# Patient Record
Sex: Male | Born: 1975 | Race: White | Hispanic: No | Marital: Single | Smoking: Never smoker
Health system: Southern US, Community
[De-identification: ages and names within clinical notes are randomized; demographics above are authoritative.]

## PROBLEM LIST (undated history)

## (undated) ENCOUNTER — Emergency Department (EMERGENCY_DEPARTMENT_HOSPITAL): Admission: EM | Payer: No Typology Code available for payment source | Source: Home / Self Care

## (undated) DIAGNOSIS — I10 Essential (primary) hypertension: Secondary | ICD-10-CM

## (undated) DEATH — deceased

---

## 2000-08-24 ENCOUNTER — Encounter (HOSPITAL_BASED_OUTPATIENT_CLINIC_OR_DEPARTMENT_OTHER): Payer: Self-pay | Admitting: Neuropsychiatry

## 2005-03-22 ENCOUNTER — Ambulatory Visit (EMERGENCY_DEPARTMENT_HOSPITAL): Payer: Medicaid Other

## 2005-04-03 ENCOUNTER — Ambulatory Visit (HOSPITAL_BASED_OUTPATIENT_CLINIC_OR_DEPARTMENT_OTHER): Payer: Self-pay

## 2005-09-24 ENCOUNTER — Ambulatory Visit (EMERGENCY_DEPARTMENT_HOSPITAL)

## 2005-12-19 ENCOUNTER — Encounter (HOSPITAL_BASED_OUTPATIENT_CLINIC_OR_DEPARTMENT_OTHER): Payer: Self-pay | Admitting: Physician Assistant

## 2009-07-26 ENCOUNTER — Ambulatory Visit (HOSPITAL_BASED_OUTPATIENT_CLINIC_OR_DEPARTMENT_OTHER): Payer: Medicaid Other

## 2009-07-26 ENCOUNTER — Emergency Department (HOSPITAL_BASED_OUTPATIENT_CLINIC_OR_DEPARTMENT_OTHER)
Admission: EM | Admit: 2009-07-26 | Discharge: 2009-07-26 | Disposition: A | Discharge status: Home / Self Care | Payer: Medicaid Other | Attending: Nurse Practitioner | Admitting: Nurse Practitioner

## 2009-07-26 DIAGNOSIS — F1410 Cocaine abuse, uncomplicated: Secondary | ICD-10-CM | POA: Insufficient documentation

## 2009-07-26 DIAGNOSIS — F39 Unspecified mood [affective] disorder: Secondary | ICD-10-CM | POA: Insufficient documentation

## 2009-07-26 DIAGNOSIS — F1020 Alcohol dependence, uncomplicated: Secondary | ICD-10-CM | POA: Insufficient documentation

## 2009-09-18 ENCOUNTER — Emergency Department (HOSPITAL_BASED_OUTPATIENT_CLINIC_OR_DEPARTMENT_OTHER)
Admission: EM | Admit: 2009-09-18 | Discharge: 2009-09-19 | Disposition: A | Discharge status: Other Acute Inpatient Hospital | Payer: Medicaid Other | Attending: Nurse Practitioner | Admitting: Nurse Practitioner

## 2009-09-18 DIAGNOSIS — F39 Unspecified mood [affective] disorder: Secondary | ICD-10-CM | POA: Insufficient documentation

## 2009-09-18 DIAGNOSIS — R45851 Suicidal ideations: Secondary | ICD-10-CM | POA: Insufficient documentation

## 2009-09-18 DIAGNOSIS — F3289 Other specified depressive episodes: Secondary | ICD-10-CM | POA: Insufficient documentation

## 2009-09-18 DIAGNOSIS — F1020 Alcohol dependence, uncomplicated: Secondary | ICD-10-CM | POA: Insufficient documentation

## 2009-09-18 DIAGNOSIS — F1410 Cocaine abuse, uncomplicated: Secondary | ICD-10-CM | POA: Insufficient documentation

## 2009-10-30 ENCOUNTER — Emergency Department (HOSPITAL_BASED_OUTPATIENT_CLINIC_OR_DEPARTMENT_OTHER)
Admission: EM | Admit: 2009-10-30 | Discharge: 2009-10-30 | Disposition: A | Discharge status: Home / Self Care | Payer: Medicaid Other | Attending: Emergency Medicine | Admitting: Emergency Medicine

## 2009-10-30 DIAGNOSIS — F1910 Other psychoactive substance abuse, uncomplicated: Secondary | ICD-10-CM | POA: Insufficient documentation

## 2009-10-31 ENCOUNTER — Emergency Department (HOSPITAL_BASED_OUTPATIENT_CLINIC_OR_DEPARTMENT_OTHER)
Admission: EM | Admit: 2009-10-31 | Discharge: 2009-10-31 | Payer: Medicaid Other | Attending: Psychiatry | Admitting: Psychiatry

## 2009-10-31 DIAGNOSIS — F1020 Alcohol dependence, uncomplicated: Secondary | ICD-10-CM | POA: Insufficient documentation

## 2009-10-31 DIAGNOSIS — F29 Unspecified psychosis not due to a substance or known physiological condition: Secondary | ICD-10-CM | POA: Insufficient documentation

## 2009-10-31 DIAGNOSIS — F1220 Cannabis dependence, uncomplicated: Secondary | ICD-10-CM | POA: Insufficient documentation

## 2009-10-31 DIAGNOSIS — Z5900 Homelessness unspecified: Secondary | ICD-10-CM | POA: Insufficient documentation

## 2009-10-31 DIAGNOSIS — F602 Antisocial personality disorder: Secondary | ICD-10-CM | POA: Insufficient documentation

## 2009-10-31 DIAGNOSIS — F3289 Other specified depressive episodes: Secondary | ICD-10-CM | POA: Insufficient documentation

## 2009-10-31 DIAGNOSIS — R45851 Suicidal ideations: Secondary | ICD-10-CM | POA: Insufficient documentation

## 2010-07-24 ENCOUNTER — Emergency Department (HOSPITAL_BASED_OUTPATIENT_CLINIC_OR_DEPARTMENT_OTHER)
Admission: EM | Admit: 2010-07-24 | Discharge: 2010-07-24 | Disposition: A | Discharge status: Home / Self Care | Payer: Self-pay | Attending: Student in an Organized Health Care Education/Training Program | Admitting: Student in an Organized Health Care Education/Training Program

## 2010-07-24 DIAGNOSIS — T148XXA Other injury of unspecified body region, initial encounter: Secondary | ICD-10-CM | POA: Insufficient documentation

## 2010-07-24 DIAGNOSIS — S71009A Unspecified open wound, unspecified hip, initial encounter: Secondary | ICD-10-CM | POA: Insufficient documentation

## 2010-07-24 DIAGNOSIS — S71109A Unspecified open wound, unspecified thigh, initial encounter: Secondary | ICD-10-CM | POA: Insufficient documentation

## 2010-11-04 ENCOUNTER — Emergency Department
Admission: EM | Admit: 2010-11-04 | Discharge: 2010-11-04 | Disposition: A | Discharge status: Home / Self Care | Payer: Medicaid Other | Attending: Registered Nurse | Admitting: Registered Nurse

## 2010-11-04 DIAGNOSIS — IMO0002 Reserved for concepts with insufficient information to code with codable children: Secondary | ICD-10-CM | POA: Insufficient documentation

## 2010-11-04 DIAGNOSIS — Y9301 Activity, walking, marching and hiking: Secondary | ICD-10-CM | POA: Insufficient documentation

## 2010-11-04 DIAGNOSIS — W010XXA Fall on same level from slipping, tripping and stumbling without subsequent striking against object, initial encounter: Secondary | ICD-10-CM | POA: Insufficient documentation

## 2011-07-12 ENCOUNTER — Other Ambulatory Visit (EMERGENCY_DEPARTMENT_HOSPITAL): Payer: Self-pay

## 2011-07-12 ENCOUNTER — Emergency Department (HOSPITAL_BASED_OUTPATIENT_CLINIC_OR_DEPARTMENT_OTHER)
Admission: EM | Admit: 2011-07-12 | Discharge: 2011-07-13 | Disposition: A | Discharge status: Home / Self Care | Payer: Self-pay | Attending: Psychiatric/Mental Health | Admitting: Psychiatric/Mental Health

## 2011-07-12 DIAGNOSIS — F3289 Other specified depressive episodes: Secondary | ICD-10-CM | POA: Insufficient documentation

## 2011-07-12 DIAGNOSIS — F1020 Alcohol dependence, uncomplicated: Secondary | ICD-10-CM | POA: Insufficient documentation

## 2011-07-12 DIAGNOSIS — F1220 Cannabis dependence, uncomplicated: Secondary | ICD-10-CM | POA: Insufficient documentation

## 2011-07-12 DIAGNOSIS — F29 Unspecified psychosis not due to a substance or known physiological condition: Secondary | ICD-10-CM | POA: Insufficient documentation

## 2011-07-12 DIAGNOSIS — Z5900 Homelessness unspecified: Secondary | ICD-10-CM | POA: Insufficient documentation

## 2011-07-12 DIAGNOSIS — F602 Antisocial personality disorder: Secondary | ICD-10-CM | POA: Insufficient documentation

## 2011-07-12 LAB — DIFF/SMEAR EVALUATION ADD
% Basophils: 0 % (ref 0–1)
% Eosinophils: 1 % (ref 0–7)
% Immature Granulocytes: 0 % (ref 0–1)
% Lymphocytes: 35 % (ref 19–53)
% Monocytes: 12 % (ref 5–13)
% Neutrophils: 52 % (ref 34–71)
Absolute Eosinophil Count: 0.08 10*3/uL (ref 0.00–0.50)
Absolute Lymphocyte Count: 3.56 10*3/uL (ref 1.00–4.80)
Basophils: 0.03 10*3/uL (ref 0.00–0.20)
Immature Granulocytes: 0.03 10*3/uL (ref 0.00–0.05)
Monocytes: 1.2 10*3/uL — ABNORMAL HIGH (ref 0.00–0.80)
Neutrophils: 5.39 10*3/uL (ref 1.80–7.00)

## 2011-07-12 LAB — CBC (HEMOGRAM)
Hematocrit: 39 % (ref 38–50)
Hemoglobin: 13.3 g/dL (ref 13.0–18.0)
MCH: 30.1 pg (ref 27.3–33.6)
MCHC: 34.3 g/dL (ref 32.2–36.5)
MCV: 88 fL (ref 81–98)
Platelet Count: 179 10*3/uL (ref 150–400)
RBC: 4.42 mil/uL (ref 4.40–5.60)
RDW-CV: 12.3 % (ref 11.6–14.4)
WBC: 10.26 10*3/uL — ABNORMAL HIGH (ref 4.30–10.00)

## 2011-07-12 LAB — COMPREHENSIVE METABOLIC PANEL
ALT (GPT): 15 U/L (ref 10–64)
AST (GOT): 27 U/L (ref 15–40)
Albumin: 4.1 g/dL (ref 3.5–5.2)
Alkaline Phosphatase (Total): 47 U/L (ref 36–122)
Anion Gap: 13 — ABNORMAL HIGH (ref 3–11)
Bilirubin (Total): 0.7 mg/dL (ref 0.2–1.3)
Calcium: 8.7 mg/dL — ABNORMAL LOW (ref 8.9–10.2)
Carbon Dioxide, Total: 25 mEq/L (ref 22–32)
Chloride: 102 mEq/L (ref 98–108)
Creatinine: 1.01 mg/dL (ref 0.51–1.18)
GFR, Calc, African American: >60 mL/min (ref >59)
GFR, Calc, European American: >60 mL/min (ref >59)
Glucose: 99 mg/dL (ref 62–125)
Potassium: 3.9 mEq/L (ref 3.7–5.2)
Protein (Total): 7 g/dL (ref 6.0–8.2)
Sodium: 140 mEq/L (ref 136–145)
Urea Nitrogen: 13 mg/dL (ref 8–21)

## 2011-07-12 LAB — 1ST EXTRA RED TOP

## 2011-07-12 LAB — ALCOHOL (ETHYL): Alcohol (Ethyl): 22 mg/dL — AB

## 2011-07-13 ENCOUNTER — Ambulatory Visit (HOSPITAL_BASED_OUTPATIENT_CLINIC_OR_DEPARTMENT_OTHER): Admit: 2011-07-13 | Discharge: 2011-07-13 | Disposition: A | Discharge status: Home / Self Care | Payer: Self-pay

## 2017-04-01 ENCOUNTER — Encounter: Payer: Self-pay | Admitting: *Deleted

## 2017-04-01 ENCOUNTER — Emergency Department
Admission: EM | Admit: 2017-04-01 | Discharge: 2017-04-01 | Disposition: A | Discharge status: Home / Self Care | Payer: Self-pay | Attending: Emergency Medicine | Admitting: Emergency Medicine

## 2017-04-01 ENCOUNTER — Emergency Department: Payer: Self-pay

## 2017-04-01 DIAGNOSIS — K047 Periapical abscess without sinus: Secondary | ICD-10-CM

## 2017-04-01 DIAGNOSIS — I10 Essential (primary) hypertension: Secondary | ICD-10-CM | POA: Insufficient documentation

## 2017-04-01 DIAGNOSIS — S20211D Contusion of right front wall of thorax, subsequent encounter: Secondary | ICD-10-CM

## 2017-04-01 HISTORY — DX: Essential (primary) hypertension: I10

## 2017-04-01 MED ORDER — PENICILLIN V POTASSIUM 500 MG PO TABS: 500.0000 mg | ORAL_TABLET | Freq: Four times a day (QID) | ORAL | 0 refills | Status: AC

## 2017-04-01 MED ORDER — PENICILLIN V POTASSIUM 500 MG PO TABS
500.0000 mg | ORAL_TABLET | Freq: Once | ORAL | Status: AC
  Administered 2017-04-01: 500 mg via ORAL
  Filled 2017-04-01: qty 1

## 2017-04-01 MED ORDER — LIDOCAINE-EPINEPHRINE 2 %-1:100000 IJ SOLN
1.7000 mL | Freq: Once | INTRAMUSCULAR | Status: AC
  Administered 2017-04-01: 1.7 mL
  Filled 2017-04-01: qty 1.7

## 2017-04-01 NOTE — Discharge Instructions (Signed)
Your exam and x-ray are negative for any evidence of fracture or dislocation to the ribs. You have been treated for an acute dental infection. Take the antibiotic as directed, until all of the pills are gone. Take Aleve for pain relief. Brush with a soft bristle toothbrush and rinse with warm-salty water after each meal. Follow-up with [facility], they take walk-ins for dental visits.

## 2017-04-01 NOTE — ED Notes (Signed)
Pt reports that he has dental abscess on upper left tooth and it radiates into the upper jaw and temple area - he states he went to the dentist yesterday and was told he needed atb but was told he did not have the proper insurance to be treated

## 2017-04-01 NOTE — ED Notes (Signed)
Pt was given a free bus pass for transportation to home.
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
## 2017-06-25 ENCOUNTER — Emergency Department (HOSPITAL_BASED_OUTPATIENT_CLINIC_OR_DEPARTMENT_OTHER)
Admission: EM | Admit: 2017-06-25 | Discharge: 2017-06-25 | Disposition: A | Discharge status: Home / Self Care | Payer: No Typology Code available for payment source | Attending: Nurse Practitioner | Admitting: Nurse Practitioner

## 2017-06-25 DIAGNOSIS — Z590 Homelessness: Secondary | ICD-10-CM | POA: Insufficient documentation

## 2017-06-25 DIAGNOSIS — L02811 Cutaneous abscess of head [any part, except face]: Secondary | ICD-10-CM | POA: Insufficient documentation

## 2017-08-16 ENCOUNTER — Emergency Department (HOSPITAL_BASED_OUTPATIENT_CLINIC_OR_DEPARTMENT_OTHER)
Admission: EM | Admit: 2017-08-16 | Discharge: 2017-08-17 | Disposition: A | Discharge status: Other Acute Inpatient Hospital | Payer: No Typology Code available for payment source | Attending: Emergency Medicine | Admitting: Emergency Medicine

## 2017-08-16 DIAGNOSIS — X838XXA Intentional self-harm by other specified means, initial encounter: Secondary | ICD-10-CM | POA: Insufficient documentation

## 2017-08-16 DIAGNOSIS — Z590 Homelessness: Secondary | ICD-10-CM | POA: Insufficient documentation

## 2017-08-16 DIAGNOSIS — F29 Unspecified psychosis not due to a substance or known physiological condition: Secondary | ICD-10-CM | POA: Insufficient documentation

## 2017-08-16 DIAGNOSIS — S199XXA Unspecified injury of neck, initial encounter: Secondary | ICD-10-CM | POA: Insufficient documentation

## 2017-08-16 LAB — CBC (HEMOGRAM)
Hematocrit: 43 % (ref 38–50)
Hemoglobin: 13.6 g/dL (ref 13.0–18.0)
MCH: 30 pg (ref 27.3–33.6)
MCHC: 31.9 g/dL — ABNORMAL LOW (ref 32.2–36.5)
MCV: 94 fL (ref 81–98)
Platelet Count: 239 10*3/uL (ref 150–400)
RBC: 4.54 10*6/uL (ref 4.40–5.60)
RDW-CV: 12.4 % (ref 11.6–14.4)
WBC: 10.1 10*3/uL — ABNORMAL HIGH (ref 4.30–10.00)

## 2017-08-16 LAB — BASIC METABOLIC PANEL
Anion Gap: 7 (ref 4–12)
Calcium: 9.6 mg/dL (ref 8.9–10.2)
Carbon Dioxide, Total: 27 meq/L (ref 22–32)
Chloride: 103 meq/L (ref 98–108)
Creatinine: 0.67 mg/dL (ref 0.51–1.18)
GFR, Calc, African American: >60 mL/min/{1.73_m2} (ref >59)
GFR, Calc, European American: >60 mL/min/{1.73_m2} (ref >59)
Glucose: 85 mg/dL (ref 62–125)
Potassium: 4 meq/L (ref 3.6–5.2)
Sodium: 137 meq/L (ref 135–145)
Urea Nitrogen: 16 mg/dL (ref 8–21)

## 2017-08-16 LAB — ALCOHOL (ETHYL): Alcohol (Ethyl): NEGATIVE mg/dL

## 2017-08-17 ENCOUNTER — Other Ambulatory Visit: Payer: Self-pay | Admitting: Student in an Organized Health Care Education/Training Program

## 2017-08-17 DIAGNOSIS — S199XXA Unspecified injury of neck, initial encounter: Secondary | ICD-10-CM

## 2017-08-17 LAB — STANDARD DRUG SCREEN, URN
Acetaminophen Qualitative, URN: NEGATIVE
Alcohol (Ethyl), URN: NEGATIVE mg/dL
Amphet/Methamphetamine Qual,URN: NEGATIVE
Barbiturate (Qual), URN: NEGATIVE
Benzodiazepines (Qual), URN: NEGATIVE
Cannabinoids (Qual), URN: POSITIVE — AB
Cocaine (Qual), URN: NEGATIVE
Methadone (Qual), URN: NEGATIVE
Opiates (Qual), URN: NEGATIVE
Phencyclidine (Qual), URN: NEGATIVE
Tricyclic Antidepressants, URN: NEGATIVE

## 2017-08-25 ENCOUNTER — Encounter (HOSPITAL_BASED_OUTPATIENT_CLINIC_OR_DEPARTMENT_OTHER): Payer: No Typology Code available for payment source | Admitting: Nurse Practitioner

## 2017-09-04 ENCOUNTER — Emergency Department (HOSPITAL_BASED_OUTPATIENT_CLINIC_OR_DEPARTMENT_OTHER): Admission: EM | Admit: 2017-09-04 | Discharge: 2017-09-04 | Payer: No Typology Code available for payment source

## 2017-10-20 ENCOUNTER — Ambulatory Visit (HOSPITAL_BASED_OUTPATIENT_CLINIC_OR_DEPARTMENT_OTHER): Payer: No Typology Code available for payment source | Admitting: Nurse Practitioner

## 2017-10-20 DIAGNOSIS — G8929 Other chronic pain: Secondary | ICD-10-CM

## 2017-10-20 DIAGNOSIS — M546 Pain in thoracic spine: Secondary | ICD-10-CM
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
## 2017-11-04 ENCOUNTER — Other Ambulatory Visit: Payer: Self-pay | Admitting: Physician Assistant

## 2017-11-04 ENCOUNTER — Emergency Department (HOSPITAL_BASED_OUTPATIENT_CLINIC_OR_DEPARTMENT_OTHER)
Admission: EM | Admit: 2017-11-04 | Discharge: 2017-11-04 | Disposition: A | Discharge status: Home / Self Care | Payer: No Typology Code available for payment source | Attending: Physician Assistant | Admitting: Physician Assistant

## 2017-11-04 DIAGNOSIS — R05 Cough: Secondary | ICD-10-CM | POA: Insufficient documentation

## 2017-11-04 DIAGNOSIS — R0981 Nasal congestion: Secondary | ICD-10-CM | POA: Insufficient documentation

## 2017-11-04 DIAGNOSIS — Z590 Homelessness: Secondary | ICD-10-CM | POA: Insufficient documentation

## 2017-11-04 DIAGNOSIS — F17210 Nicotine dependence, cigarettes, uncomplicated: Secondary | ICD-10-CM | POA: Insufficient documentation

## 2017-11-04 DIAGNOSIS — Z20828 Contact with and (suspected) exposure to other viral communicable diseases: Secondary | ICD-10-CM | POA: Insufficient documentation

## 2017-11-04 DIAGNOSIS — B356 Tinea cruris: Secondary | ICD-10-CM | POA: Insufficient documentation

## 2017-11-05 ENCOUNTER — Encounter (HOSPITAL_BASED_OUTPATIENT_CLINIC_OR_DEPARTMENT_OTHER): Payer: No Typology Code available for payment source | Admitting: Nurse Practitioner
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
## 2018-01-29 ENCOUNTER — Other Ambulatory Visit: Payer: Self-pay | Admitting: Emergency Medicine

## 2018-01-29 ENCOUNTER — Emergency Department (HOSPITAL_BASED_OUTPATIENT_CLINIC_OR_DEPARTMENT_OTHER)
Admission: EM | Admit: 2018-01-29 | Discharge: 2018-01-29 | Disposition: A | Discharge status: Home / Self Care | Payer: No Typology Code available for payment source | Attending: Emergency Medicine | Admitting: Emergency Medicine

## 2018-01-29 DIAGNOSIS — S86812A Strain of other muscle(s) and tendon(s) at lower leg level, left leg, initial encounter: Secondary | ICD-10-CM | POA: Insufficient documentation

## 2018-01-29 DIAGNOSIS — M7042 Prepatellar bursitis, left knee: Secondary | ICD-10-CM | POA: Insufficient documentation

## 2018-01-29 DIAGNOSIS — S79922A Unspecified injury of left thigh, initial encounter: Secondary | ICD-10-CM | POA: Insufficient documentation

## 2018-01-29 DIAGNOSIS — S8992XA Unspecified injury of left lower leg, initial encounter: Secondary | ICD-10-CM

## 2018-01-29 DIAGNOSIS — W19XXXA Unspecified fall, initial encounter: Secondary | ICD-10-CM | POA: Insufficient documentation

## 2018-01-31 ENCOUNTER — Inpatient Hospital Stay: Payer: Self-pay

## 2018-02-01 ENCOUNTER — Inpatient Hospital Stay: Payer: Self-pay

## 2018-02-04 ENCOUNTER — Other Ambulatory Visit (HOSPITAL_BASED_OUTPATIENT_CLINIC_OR_DEPARTMENT_OTHER): Payer: Self-pay

## 2018-02-04 DIAGNOSIS — S82002A Unspecified fracture of left patella, initial encounter for closed fracture: Secondary | ICD-10-CM

## 2018-02-11 ENCOUNTER — Ambulatory Visit (HOSPITAL_BASED_OUTPATIENT_CLINIC_OR_DEPARTMENT_OTHER): Payer: No Typology Code available for payment source

## 2018-02-22 ENCOUNTER — Inpatient Hospital Stay: Payer: Self-pay

## 2018-03-27 ENCOUNTER — Inpatient Hospital Stay: Payer: Self-pay

## 2018-03-27 ENCOUNTER — Emergency Department (HOSPITAL_BASED_OUTPATIENT_CLINIC_OR_DEPARTMENT_OTHER)
Admission: EM | Admit: 2018-03-27 | Discharge: 2018-03-28 | Payer: No Typology Code available for payment source | Attending: Psychiatric/Mental Health | Admitting: Psychiatric/Mental Health

## 2018-03-27 DIAGNOSIS — F250 Schizoaffective disorder, bipolar type: Secondary | ICD-10-CM | POA: Insufficient documentation

## 2018-03-27 DIAGNOSIS — F329 Major depressive disorder, single episode, unspecified: Secondary | ICD-10-CM | POA: Insufficient documentation

## 2018-03-27 DIAGNOSIS — F1510 Other stimulant abuse, uncomplicated: Secondary | ICD-10-CM | POA: Insufficient documentation

## 2018-03-27 DIAGNOSIS — R45851 Suicidal ideations: Secondary | ICD-10-CM | POA: Insufficient documentation

## 2018-03-27 DIAGNOSIS — Z590 Homelessness: Secondary | ICD-10-CM

## 2018-03-27 LAB — STANDARD DRUG SCREEN, URN
Acetaminophen Qualitative, URN: NEGATIVE
Alcohol (Ethyl), URN: NEGATIVE mg/dL
Amphet/Methamphetamine Qual,URN: POSITIVE — AB
Barbiturate (Qual), URN: NEGATIVE
Benzodiazepines (Qual), URN: NEGATIVE
Cannabinoids (Qual), URN: POSITIVE — AB
Cocaine (Qual), URN: NEGATIVE
Methadone (Qual), URN: NEGATIVE
Opiates (Qual), URN: NEGATIVE
Phencyclidine (Qual), URN: NEGATIVE
Tricyclic Antidepressants, URN: NEGATIVE

## 2018-04-15 ENCOUNTER — Inpatient Hospital Stay (HOSPITAL_BASED_OUTPATIENT_CLINIC_OR_DEPARTMENT_OTHER)
Admission: EM | Admit: 2018-04-15 | Discharge: 2018-04-20 | DRG: 460 | Disposition: A | Discharge status: Other Acute Inpatient Hospital | Payer: No Typology Code available for payment source | Attending: Internal Medicine | Admitting: Internal Medicine

## 2018-04-15 ENCOUNTER — Inpatient Hospital Stay (HOSPITAL_COMMUNITY): Payer: No Typology Code available for payment source | Admitting: Internal Medicine

## 2018-04-15 DIAGNOSIS — R34 Anuria and oliguria: Secondary | ICD-10-CM | POA: Diagnosis present

## 2018-04-15 DIAGNOSIS — F329 Major depressive disorder, single episode, unspecified: Secondary | ICD-10-CM

## 2018-04-15 DIAGNOSIS — E872 Acidosis: Secondary | ICD-10-CM | POA: Diagnosis present

## 2018-04-15 DIAGNOSIS — Z886 Allergy status to analgesic agent status: Secondary | ICD-10-CM

## 2018-04-15 DIAGNOSIS — N179 Acute kidney failure, unspecified: Principal | ICD-10-CM | POA: Diagnosis present

## 2018-04-15 DIAGNOSIS — F15159 Other stimulant abuse with stimulant-induced psychotic disorder, unspecified: Secondary | ICD-10-CM | POA: Diagnosis present

## 2018-04-15 DIAGNOSIS — F1210 Cannabis abuse, uncomplicated: Secondary | ICD-10-CM

## 2018-04-15 DIAGNOSIS — B1920 Unspecified viral hepatitis C without hepatic coma: Secondary | ICD-10-CM | POA: Diagnosis present

## 2018-04-15 DIAGNOSIS — Z781 Physical restraint status: Secondary | ICD-10-CM

## 2018-04-15 DIAGNOSIS — F1010 Alcohol abuse, uncomplicated: Secondary | ICD-10-CM

## 2018-04-15 DIAGNOSIS — F1510 Other stimulant abuse, uncomplicated: Secondary | ICD-10-CM

## 2018-04-15 DIAGNOSIS — F12159 Cannabis abuse with psychotic disorder, unspecified: Secondary | ICD-10-CM | POA: Diagnosis present

## 2018-04-15 DIAGNOSIS — F1190 Opioid use, unspecified, uncomplicated: Secondary | ICD-10-CM

## 2018-04-15 DIAGNOSIS — Z915 Personal history of self-harm: Secondary | ICD-10-CM

## 2018-04-15 DIAGNOSIS — Z818 Family history of other mental and behavioral disorders: Secondary | ICD-10-CM

## 2018-04-15 DIAGNOSIS — Z9114 Patient's other noncompliance with medication regimen: Secondary | ICD-10-CM

## 2018-04-15 DIAGNOSIS — F19959 Other psychoactive substance use, unspecified with psychoactive substance-induced psychotic disorder, unspecified: Secondary | ICD-10-CM

## 2018-04-15 DIAGNOSIS — Z62810 Personal history of physical and sexual abuse in childhood: Secondary | ICD-10-CM | POA: Diagnosis present

## 2018-04-15 DIAGNOSIS — Z888 Allergy status to other drugs, medicaments and biological substances status: Secondary | ICD-10-CM

## 2018-04-15 DIAGNOSIS — F250 Schizoaffective disorder, bipolar type: Secondary | ICD-10-CM | POA: Diagnosis present

## 2018-04-15 DIAGNOSIS — F11159 Opioid abuse with opioid-induced psychotic disorder, unspecified: Secondary | ICD-10-CM | POA: Diagnosis not present

## 2018-04-15 DIAGNOSIS — M6282 Rhabdomyolysis: Secondary | ICD-10-CM | POA: Diagnosis present

## 2018-04-15 DIAGNOSIS — R45851 Suicidal ideations: Secondary | ICD-10-CM | POA: Diagnosis present

## 2018-04-15 DIAGNOSIS — Z590 Homelessness: Secondary | ICD-10-CM

## 2018-04-15 LAB — CBC (HEMOGRAM)
Hematocrit: 43 % (ref 38–50)
Hemoglobin: 14.4 g/dL (ref 13.0–18.0)
MCH: 29.9 pg (ref 27.3–33.6)
MCHC: 33.4 g/dL (ref 32.2–36.5)
MCV: 89 fL (ref 81–98)
Platelet Count: 248 10*3/uL (ref 150–400)
RBC: 4.82 10*6/uL (ref 4.40–5.60)
RDW-CV: 12.9 % (ref 11.6–14.4)
WBC: 14.37 10*3/uL — ABNORMAL HIGH (ref 4.30–10.00)

## 2018-04-15 LAB — BASIC METABOLIC PANEL
Anion Gap: 14 — ABNORMAL HIGH (ref 4–12)
Calcium: 8.7 mg/dL — ABNORMAL LOW (ref 8.9–10.2)
Carbon Dioxide, Total: 23 meq/L (ref 22–32)
Chloride: 94 meq/L — ABNORMAL LOW (ref 98–108)
Creatinine: 2.94 mg/dL — ABNORMAL HIGH (ref 0.51–1.18)
GFR, Calc, African American: 29 mL/min/{1.73_m2} — ABNORMAL LOW (ref >59)
GFR, Calc, European American: 24 mL/min/{1.73_m2} — ABNORMAL LOW (ref >59)
Glucose: 95 mg/dL (ref 62–125)
Potassium: 4 meq/L (ref 3.6–5.2)
Sodium: 131 meq/L — ABNORMAL LOW (ref 135–145)
Urea Nitrogen: 63 mg/dL — ABNORMAL HIGH (ref 8–21)

## 2018-04-15 LAB — COMPREHENSIVE METABOLIC PANEL
ALT (GPT): 47 U/L (ref 10–64)
AST (GOT): 54 U/L — ABNORMAL HIGH (ref 9–38)
Albumin: 4.9 g/dL (ref 3.5–5.2)
Alkaline Phosphatase (Total): 58 U/L (ref 36–122)
Anion Gap: 18 — ABNORMAL HIGH (ref 4–12)
Bilirubin (Total): 1.3 mg/dL (ref 0.2–1.3)
Calcium: 9.4 mg/dL (ref 8.9–10.2)
Carbon Dioxide, Total: 21 meq/L — ABNORMAL LOW (ref 22–32)
Chloride: 92 meq/L — ABNORMAL LOW (ref 98–108)
Creatinine: 3.5 mg/dL — ABNORMAL HIGH (ref 0.51–1.18)
GFR, Calc, African American: 23 mL/min/{1.73_m2} — ABNORMAL LOW (ref >59)
GFR, Calc, European American: 19 mL/min/{1.73_m2} — ABNORMAL LOW (ref >59)
Glucose: 100 mg/dL (ref 62–125)
Potassium: 4.4 meq/L (ref 3.6–5.2)
Protein (Total): 8.8 g/dL — ABNORMAL HIGH (ref 6.0–8.2)
Sodium: 131 meq/L — ABNORMAL LOW (ref 135–145)
Urea Nitrogen: 65 mg/dL — ABNORMAL HIGH (ref 8–21)

## 2018-04-15 LAB — LAB ADD ON ORDER

## 2018-04-15 LAB — CK, CREATINE KINASE, TOTAL ACTIVITY
Creatine Kinase Total Activity: 1524 U/L — ABNORMAL HIGH (ref 62–325)
Creatine Kinase Total Activity: 1314 U/L — ABNORMAL HIGH (ref 62–325)

## 2018-04-15 LAB — ALCOHOL (ETHYL): Alcohol (Ethyl): NEGATIVE mg/dL

## 2018-04-15 LAB — ACETAMINOPHEN (TYLENOL): Acetaminophen (Tylenol): <10 ug/mL (ref 0–25)

## 2018-04-15 LAB — LIPASE: Lipase: 8 U/L (ref <70)

## 2018-04-16 DIAGNOSIS — F1599 Other stimulant use, unspecified with unspecified stimulant-induced disorder: Secondary | ICD-10-CM

## 2018-04-16 DIAGNOSIS — F1099 Alcohol use, unspecified with unspecified alcohol-induced disorder: Secondary | ICD-10-CM

## 2018-04-16 DIAGNOSIS — F329 Major depressive disorder, single episode, unspecified: Secondary | ICD-10-CM

## 2018-04-16 LAB — URINALYSIS WITH REFLEX CULTURE
Bilirubin (Qual), URN: NEGATIVE
Cast_Hyaline, URN: <1 /LPF — AB
Epith Cells_Renal/Trans,URN: NEGATIVE /HPF
Epith Cells_Squamous, URN: NEGATIVE /LPF
Glucose Qual, URN: NEGATIVE mg/dL
Leukocyte Esterase, URN: NEGATIVE
Nitrite, URN: NEGATIVE
Occult Blood, URN: NEGATIVE
RBC, URN: NEGATIVE /HPF
Specific Gravity, URN: 1.021 g/mL (ref 1.006–1.027)
WBC, URN: NEGATIVE /HPF
pH, URN: 6 (ref 5.0–8.0)

## 2018-04-16 LAB — HIV ANTIGEN AND ANTIBODY SCRN
HIV Antigen and Antibody Interpretation: NONREACTIVE
HIV Antigen and Antibody Result: NONREACTIVE

## 2018-04-16 LAB — BASIC METABOLIC PANEL
Anion Gap: 7 (ref 4–12)
Calcium: 8.7 mg/dL — ABNORMAL LOW (ref 8.9–10.2)
Carbon Dioxide, Total: 28 meq/L (ref 22–32)
Chloride: 99 meq/L (ref 98–108)
Creatinine: 1.42 mg/dL — ABNORMAL HIGH (ref 0.51–1.18)
GFR, Calc, African American: >60 mL/min/{1.73_m2} (ref >59)
GFR, Calc, European American: 55 mL/min/{1.73_m2} — ABNORMAL LOW (ref >59)
Glucose: 85 mg/dL (ref 62–125)
Potassium: 3.6 meq/L (ref 3.6–5.2)
Sodium: 134 meq/L — ABNORMAL LOW (ref 135–145)
Urea Nitrogen: 43 mg/dL — ABNORMAL HIGH (ref 8–21)

## 2018-04-17 DIAGNOSIS — F250 Schizoaffective disorder, bipolar type: Secondary | ICD-10-CM

## 2018-04-17 LAB — BASIC METABOLIC PANEL
Anion Gap: 7 (ref 4–12)
Calcium: 9.2 mg/dL (ref 8.9–10.2)
Carbon Dioxide, Total: 29 meq/L (ref 22–32)
Chloride: 101 meq/L (ref 98–108)
Creatinine: 0.79 mg/dL (ref 0.51–1.18)
GFR, Calc, African American: >60 mL/min/{1.73_m2} (ref >59)
GFR, Calc, European American: >60 mL/min/{1.73_m2} (ref >59)
Glucose: 83 mg/dL (ref 62–125)
Potassium: 3.9 meq/L (ref 3.6–5.2)
Sodium: 137 meq/L (ref 135–145)
Urea Nitrogen: 24 mg/dL — ABNORMAL HIGH (ref 8–21)

## 2018-04-18 LAB — BASIC METABOLIC PANEL
Anion Gap: 5 (ref 4–12)
Calcium: 8.9 mg/dL (ref 8.9–10.2)
Carbon Dioxide, Total: 32 meq/L (ref 22–32)
Chloride: 101 meq/L (ref 98–108)
Creatinine: 0.91 mg/dL (ref 0.51–1.18)
GFR, Calc, African American: >60 mL/min/{1.73_m2} (ref >59)
GFR, Calc, European American: >60 mL/min/{1.73_m2} (ref >59)
Glucose: 102 mg/dL (ref 62–125)
Potassium: 4.1 meq/L (ref 3.6–5.2)
Sodium: 138 meq/L (ref 135–145)
Urea Nitrogen: 19 mg/dL (ref 8–21)

## 2018-04-19 DIAGNOSIS — Z751 Person awaiting admission to adequate facility elsewhere: Secondary | ICD-10-CM

## 2018-04-20 DIAGNOSIS — F259 Schizoaffective disorder, unspecified: Secondary | ICD-10-CM

## 2018-04-20 DIAGNOSIS — R45851 Suicidal ideations: Secondary | ICD-10-CM

## 2018-04-20 DIAGNOSIS — N179 Acute kidney failure, unspecified: Secondary | ICD-10-CM

## 2018-06-03 ENCOUNTER — Ambulatory Visit (HOSPITAL_BASED_OUTPATIENT_CLINIC_OR_DEPARTMENT_OTHER): Payer: No Typology Code available for payment source | Admitting: Unknown Physician Specialty

## 2018-06-03 ENCOUNTER — Ambulatory Visit (HOSPITAL_BASED_OUTPATIENT_CLINIC_OR_DEPARTMENT_OTHER): Payer: No Typology Code available for payment source | Attending: Unknown Physician Specialty | Admitting: Nursing

## 2018-06-03 VITALS — BP 118/71 | HR 101 | Temp 99.5°F

## 2018-06-03 DIAGNOSIS — L0291 Cutaneous abscess, unspecified: Secondary | ICD-10-CM | POA: Insufficient documentation

## 2018-06-03 DIAGNOSIS — H60311 Diffuse otitis externa, right ear: Secondary | ICD-10-CM

## 2018-06-03 LAB — WOUND CULTURE W/GRAM ORDER

## 2018-06-03 NOTE — Progress Notes (Signed)
[facility]  ===================================================================    Adam Vance is a 42 year old male who presents to the [facility] for the following:   Chief Complaint   Patient presents with    Other     EAR       PCP:  Outside PCP  Identifies Patients Who Have A Non [facility] Pcp  Phone: None  Fax: None    Interpreter used for today's visit?: no    HPI:  Adam Vance presents to clinic today ear pain.    "Ever since [place] I've felt like something was in my ear."  [place] was 3-4 months ago. Attempted to remove with the chain of necklace last night and afterwards it started bleeding. Now it "hurts like hell".  Reports he has put several wires into his ear including a wire around the back of the ear beneath the skin that he can change the shape of his ear by manipulating.  He would like all of these wires removed so that they do not damage his ear drum.     He reports feeling feverish.  Denies nausea, vomiting, malaise.     Review of Systems:  ROS as above    Social History:  Social History     Social History Narrative    Not on file       Pt       ALLERGIES:  Patient has no allergy information on record.    There are no active problems to display for this patient.    No current outpatient medications on file.     No current facility-administered medications for this visit.      Health Maintenance   Topic Date Due    Pneumococcal Vaccine: Pediatrics (0-5 years) and At-Risk Patients (6-64 years) (1 of 1 - PPSV23) 07/21/1981    Depression Monitoring (PHQ-9)  07/22/1987    Tetanus Vaccine  07/21/1994    Influenza Vaccine (1) 08/24/2018    Lipid Disorders Screening  02/03/2023    Pneumococcal Vaccine: 65+ years (1 of 2 - PCV13) 07/21/2040    HIV Screening  Completed       Physical Examination:   Adam Vance  temperature is 99.5 F (37.5 C). His blood pressure is 118/71 and his pulse is 101 (abnormal). His oxygen saturation is 97%.   Wt Readings from Last  3 Encounters:   No data found for Wt     BP Readings from Last 3 Encounters:   06/03/18 118/71     General: non-toxic appearance.  Psych: Intense stare, delusional thought process  HEENT: Pupils 5 mm equal bilaterally, right ear with yellow pus filling ear canal, swelling/erythema of tragus, able to visualize TM with clearing of some of the pus, no foreign body appreciated  Resp: Breathing nonlabored, speaking in full sentences  MSK: ambulating normally    Assessment and Plan:   Clinical impression and plan of care was discussed with patient Adam Vance.    1. Abscess  2. Acute diffuse otitis externa of right ear  - WOUND CULTURE W/GRAM ORDER  Plan was to treat with antibiotics, however, patient was not satisfied that TW was not able to remove any wires from his ear and chose to go to the ED for further eval. Discussed with patient the importance of seeking treatment for infection ASAP.  Patient confirmed his understanding of this and his plan to go immediately to ED. He was provided with a bus ticket.  Total time of approximately 15 minutes was spent face-to-face with the patient, of which more than 50% was spent counseling and coordinating care, as described above in the A/P.    ===================================================================  No future appointments.    ~~~~~~~~~~~~~~~~~~~~~~~~~~~~~~~~~~~~~~~~  Shawna Orleans, ARNP  General Internal Medicine  [facility]  [facility]  ~~~~~~~~~~~~~~~~~~~~~~~~~~~~~~~~~~~~~~~~

## 2018-06-04 ENCOUNTER — Emergency Department (HOSPITAL_BASED_OUTPATIENT_CLINIC_OR_DEPARTMENT_OTHER)
Admission: EM | Admit: 2018-06-04 | Discharge: 2018-06-05 | Payer: No Typology Code available for payment source | Attending: Emergency Medicine | Admitting: Emergency Medicine

## 2018-06-04 DIAGNOSIS — F29 Unspecified psychosis not due to a substance or known physiological condition: Secondary | ICD-10-CM | POA: Insufficient documentation

## 2018-06-04 DIAGNOSIS — F1220 Cannabis dependence, uncomplicated: Secondary | ICD-10-CM | POA: Insufficient documentation

## 2018-06-04 DIAGNOSIS — F1520 Other stimulant dependence, uncomplicated: Secondary | ICD-10-CM

## 2018-06-04 DIAGNOSIS — H6091 Unspecified otitis externa, right ear: Secondary | ICD-10-CM | POA: Insufficient documentation

## 2018-06-04 LAB — CBC, DIFF
% Basophils: 0 %
% Eosinophils: 4 %
% Immature Granulocytes: 0 %
% Lymphocytes: 32 %
% Monocytes: 13 %
% Neutrophils: 51 %
% Nucleated RBC: 0 %
Absolute Eosinophil Count: 0.31 10*3/uL (ref 0.00–0.50)
Absolute Lymphocyte Count: 2.3 10*3/uL (ref 1.00–4.80)
Basophils: 0.03 10*3/uL (ref 0.00–0.20)
Hematocrit: 42 % (ref 38–50)
Hemoglobin: 13.5 g/dL (ref 13.0–18.0)
Immature Granulocytes: 0.03 10*3/uL (ref 0.00–0.05)
MCH: 30.8 pg (ref 27.3–33.6)
MCHC: 32 g/dL — ABNORMAL LOW (ref 32.2–36.5)
MCV: 96 fL (ref 81–98)
Monocytes: 0.9 10*3/uL — ABNORMAL HIGH (ref 0.00–0.80)
Neutrophils: 3.57 10*3/uL (ref 1.80–7.00)
Nucleated RBC: 0 10*3/uL
Platelet Count: 213 10*3/uL (ref 150–400)
RBC: 4.38 10*6/uL — ABNORMAL LOW (ref 4.40–5.60)
RDW-CV: 13.4 % (ref 11.6–14.4)
WBC: 7.14 10*3/uL (ref 4.30–10.00)

## 2018-06-04 LAB — DRUG (PSYCHIATRIC) SCRN, URN
Alcohol (Ethyl), URN: NEGATIVE mg/dL
Amphet/Methamphetamine Qual,URN: POSITIVE — AB
Barbiturate (Qual), URN: NEGATIVE
Benzodiazepines (Qual), URN: NEGATIVE
Cannabinoids (Qual), URN: POSITIVE — AB
Cocaine (Qual), URN: NEGATIVE
Opiates (Qual), URN: NEGATIVE
Phencyclidine (Qual), URN: NEGATIVE

## 2018-06-04 LAB — COMPREHENSIVE METABOLIC PANEL
ALT (GPT): 63 U/L (ref 10–64)
AST (GOT): 78 U/L — ABNORMAL HIGH (ref 9–38)
Albumin: 4.1 g/dL (ref 3.5–5.2)
Alkaline Phosphatase (Total): 38 U/L (ref 36–122)
Anion Gap: 13 — ABNORMAL HIGH (ref 4–12)
Bilirubin (Total): 1.2 mg/dL (ref 0.2–1.3)
Calcium: 9 mg/dL (ref 8.9–10.2)
Carbon Dioxide, Total: 22 meq/L (ref 22–32)
Chloride: 101 meq/L (ref 98–108)
Creatinine: 0.83 mg/dL (ref 0.51–1.18)
GFR, Calc, African American: >60 mL/min/{1.73_m2} (ref >59)
GFR, Calc, European American: >60 mL/min/{1.73_m2} (ref >59)
Glucose: 76 mg/dL (ref 62–125)
Potassium: 3.5 meq/L — ABNORMAL LOW (ref 3.6–5.2)
Protein (Total): 7.5 g/dL (ref 6.0–8.2)
Sodium: 136 meq/L (ref 135–145)
Urea Nitrogen: 20 mg/dL (ref 8–21)

## 2018-06-05 LAB — WOUND C/S W/GRAM

## 2019-10-19 ENCOUNTER — Inpatient Hospital Stay: Payer: Self-pay

## 2019-12-02 ENCOUNTER — Inpatient Hospital Stay: Payer: Self-pay

## 2020-01-02 ENCOUNTER — Inpatient Hospital Stay: Payer: Self-pay

## 2020-01-25 ENCOUNTER — Inpatient Hospital Stay: Payer: Self-pay

## 2020-02-10 ENCOUNTER — Inpatient Hospital Stay: Payer: Self-pay
